# Patient Record
Sex: Female | Born: 1950 | Race: White | Hispanic: No | Smoking: Never smoker
Health system: Southern US, Community
[De-identification: ages and names within clinical notes are randomized; demographics above are authoritative.]

## PROBLEM LIST (undated history)

## (undated) DIAGNOSIS — H3530 Unspecified macular degeneration: Secondary | ICD-10-CM

## (undated) DIAGNOSIS — Q782 Osteopetrosis: Secondary | ICD-10-CM

---

## 1998-08-02 ENCOUNTER — Other Ambulatory Visit: Admission: RE | Admit: 1998-08-02 | Discharge: 1998-08-02 | Payer: Self-pay | Admitting: Obstetrics and Gynecology

## 1999-09-13 ENCOUNTER — Other Ambulatory Visit: Admission: RE | Admit: 1999-09-13 | Discharge: 1999-09-13 | Payer: Self-pay | Admitting: Obstetrics and Gynecology

## 2001-05-11 ENCOUNTER — Inpatient Hospital Stay (HOSPITAL_COMMUNITY): Admission: AD | Admit: 2001-05-11 | Discharge: 2001-05-11 | Payer: Self-pay | Admitting: Obstetrics and Gynecology

## 2001-06-25 ENCOUNTER — Encounter: Payer: Self-pay | Admitting: Obstetrics and Gynecology

## 2001-06-25 ENCOUNTER — Encounter: Admission: RE | Admit: 2001-06-25 | Discharge: 2001-06-25 | Payer: Self-pay | Admitting: Obstetrics and Gynecology

## 2005-01-19 ENCOUNTER — Encounter: Admission: RE | Admit: 2005-01-19 | Discharge: 2005-01-19 | Payer: Self-pay | Admitting: Obstetrics and Gynecology

## 2006-11-27 ENCOUNTER — Other Ambulatory Visit: Admission: RE | Admit: 2006-11-27 | Discharge: 2006-11-27 | Payer: Self-pay | Admitting: Cardiology

## 2006-12-17 ENCOUNTER — Other Ambulatory Visit: Admission: RE | Admit: 2006-12-17 | Discharge: 2006-12-17 | Payer: Self-pay | Admitting: Internal Medicine

## 2006-12-20 ENCOUNTER — Encounter: Admission: RE | Admit: 2006-12-20 | Discharge: 2006-12-20 | Payer: Self-pay | Admitting: Internal Medicine

## 2008-03-17 ENCOUNTER — Encounter: Admission: RE | Admit: 2008-03-17 | Discharge: 2008-03-17 | Payer: Self-pay | Admitting: Internal Medicine

## 2010-07-10 ENCOUNTER — Encounter: Payer: Self-pay | Admitting: Internal Medicine

## 2010-07-11 ENCOUNTER — Encounter: Payer: Self-pay | Admitting: Infectious Diseases

## 2010-10-24 ENCOUNTER — Other Ambulatory Visit (HOSPITAL_COMMUNITY): Payer: Self-pay | Admitting: Internal Medicine

## 2010-10-24 DIAGNOSIS — Z1231 Encounter for screening mammogram for malignant neoplasm of breast: Secondary | ICD-10-CM

## 2010-11-02 ENCOUNTER — Ambulatory Visit (HOSPITAL_COMMUNITY)
Admission: RE | Admit: 2010-11-02 | Discharge: 2010-11-02 | Disposition: A | Discharge status: Home / Self Care | Payer: Self-pay | Source: Ambulatory Visit | Attending: Internal Medicine | Admitting: Internal Medicine

## 2010-11-02 DIAGNOSIS — Z1231 Encounter for screening mammogram for malignant neoplasm of breast: Secondary | ICD-10-CM

## 2011-09-11 ENCOUNTER — Other Ambulatory Visit (HOSPITAL_COMMUNITY): Payer: Self-pay | Admitting: Obstetrics

## 2011-09-11 DIAGNOSIS — Z1231 Encounter for screening mammogram for malignant neoplasm of breast: Secondary | ICD-10-CM

## 2011-09-12 ENCOUNTER — Other Ambulatory Visit: Payer: Self-pay | Admitting: Obstetrics

## 2011-09-12 DIAGNOSIS — Z1231 Encounter for screening mammogram for malignant neoplasm of breast: Secondary | ICD-10-CM

## 2011-10-05 ENCOUNTER — Ambulatory Visit (HOSPITAL_COMMUNITY): Payer: Self-pay

## 2011-11-03 ENCOUNTER — Ambulatory Visit (HOSPITAL_COMMUNITY): Payer: Self-pay

## 2012-01-12 ENCOUNTER — Ambulatory Visit (HOSPITAL_COMMUNITY): Payer: Self-pay

## 2012-01-25 ENCOUNTER — Ambulatory Visit (HOSPITAL_COMMUNITY): Payer: Self-pay

## 2014-07-04 ENCOUNTER — Ambulatory Visit (INDEPENDENT_AMBULATORY_CARE_PROVIDER_SITE_OTHER): Payer: Self-pay

## 2014-07-04 ENCOUNTER — Ambulatory Visit (INDEPENDENT_AMBULATORY_CARE_PROVIDER_SITE_OTHER): Payer: Self-pay | Admitting: Internal Medicine

## 2014-07-04 VITALS — BP 96/60 | HR 68 | Temp 98.5°F | Resp 16 | Ht 65.75 in | Wt 129.1 lb

## 2014-07-04 DIAGNOSIS — M79674 Pain in right toe(s): Secondary | ICD-10-CM

## 2014-07-04 DIAGNOSIS — S90111A Contusion of right great toe without damage to nail, initial encounter: Secondary | ICD-10-CM

## 2014-07-04 NOTE — Progress Notes (Signed)
   Subjective:    Patient ID: Cynthia Herman, female    DOB: 14-Feb-1951, 64 y.o.   MRN: [id number]  HPI Walking in parking lot in flip flops, great toe hyperflexed and contused. @ weeks of swelling and pain. Most tender distal right great toe joint.   Review of Systems     Objective:   Physical Exam  Constitutional: She is oriented to person, place, and time. She appears well-developed and well-nourished. No distress.  HENT:  Head: Normocephalic.  Eyes: EOM are normal.  Neck: Normal range of motion.  Pulmonary/Chest: Effort normal.  Musculoskeletal:       Right foot: There is decreased range of motion, tenderness, bony tenderness and swelling. There is normal capillary refill, no crepitus, no deformity and no laceration.       Feet:  Neurological: She is alert and oriented to person, place, and time. She exhibits normal muscle tone. Coordination normal.  Psychiatric: She has a normal mood and affect. Her behavior is normal. Thought content normal.     [facility] reading (PRIMARY) by  Dr.Marlena Barbato fx distal and proximal phalanx medial non displaced great toe       Assessment & Plan:  Right great toe pain/FX Camwalker/RICE till pain free Recheck 3 weeks/Warned about chronic pain and counseled

## 2014-07-04 NOTE — Patient Instructions (Signed)
Toe Fracture Your caregiver has diagnosed you as having a fractured toe. A toe fracture is a break in the bone of a toe. "Buddy taping" is a way of splinting your broken toe, by taping the broken toe to the toe next to it. This "buddy taping" will keep the injured toe from moving beyond normal range of motion. Buddy taping also helps the toe heal in a more normal alignment. It may take 6 to 8 weeks for the toe injury to heal. HOME CARE INSTRUCTIONS   Leave your toes taped together for as long as directed by your caregiver or until you see a doctor for a follow-up examination. You can change the tape after bathing. Always use a small piece of gauze or cotton between the toes when taping them together. This will help the skin stay dry and prevent infection.  Apply ice to the injury for 15-20 minutes each hour while awake for the first 2 days. Put the ice in a plastic bag and place a towel between the bag of ice and your skin.  After the first 2 days, apply heat to the injured area. Use heat for the next 2 to 3 days. Place a heating pad on the foot or soak the foot in warm water as directed by your caregiver.  Keep your foot elevated as much as possible to lessen swelling.  Wear sturdy, supportive shoes. The shoes should not pinch the toes or fit tightly against the toes.  Your caregiver may prescribe a rigid shoe if your foot is very swollen.  Your may be given crutches if the pain is too great and it hurts too much to walk.  Only take over-the-counter or prescription medicines for pain, discomfort, or fever as directed by your caregiver.  If your caregiver has given you a follow-up appointment, it is very important to keep that appointment. Not keeping the appointment could result in a chronic or permanent injury, pain, and disability. If there is any problem keeping the appointment, you must call back to this facility for assistance. SEEK MEDICAL CARE IF:   You have increased pain or swelling,  not relieved with medications.  The pain does not get better after 1 week.  Your injured toe is cold when the others are warm. SEEK IMMEDIATE MEDICAL CARE IF:   The toe becomes cold, numb, or white.  The toe becomes hot (inflamed) and red. Document Released: 06/02/2000 Document Revised: 08/28/2011 Document Reviewed: 01/20/2008 [facility] Patient Information 2015 [place], [place]. This information is not intended to replace advice given to you by your health care provider. Make sure you discuss any questions you have with your health care provider. Toe Fracture  with Rehab A fracture is a break in the bone that can be either partial or complete. Fractures of the toe bones may or may not include the joints that separate the bones. SYMPTOMS   Severe pain over the fracture site at the time of injury that may persist for an extend period of time.  Pain, tenderness, inflammation, and/or bruising (contusion) over the fracture site.  Visible deformity, if the bone fragments are not properly aligned (displaced fracture).  Signs of vascular damage: numbness or coldness (uncommon). CAUSES  Toe fractures occur when a force is placed on the bone that is greater than it can withstand.  Direct hit (trauma) to the toe.  Indirect trauma to the toe, such as forcefully pivoting on a planted foot. RISK INCREASES WITH:  Performing activities barefoot (i.e. ballet, gymnastics).  Wearing shoes with little support or protection.  Sports with cleats (i.e. football, rugby, lacrosse, soccer).  Bone disease (i.e. osteoporosis, bone tumors). PREVENTION   Wear properly fitted and protective shoes.  Protect previously injured toes with tape or padding. PROGNOSIS  If treated properly, toe fractures usually heal within 4 to 6 weeks. RELATED COMPLICATIONS   Failure of the fracture to heal (nonunion).  Healing of the fracture in a poor position (malunion).  Recurring symptoms.  Recurring symptoms  that result in a chronic problem.  Excessive bleeding, causing pressure on nerves and blood vessels (rare).  Arthritis of the affected joints.  Stopping of bone growth in children.  Infection in fractures where the skin is broken over the fracture (open fracture).  Shortening of injured bones. TREATMENT  Treatment first involves the use of ice and medicine to reduce pain and inflammation. The toe should be restrained for a period of time to allow for healing, usually about 4 weeks. Your caregiver may advise wearing a hard-soled shoe to minimize stress on the healing bone. Surgery is uncommon for this injury, but may be necessary if the fracture is severely displaced or if the bone pushes through the skin. Surgery typically involves the use of screws, pins, and/or plates to hold the fracture in place. After surgery, restraint of the foot is necessary. MEDICATION   If pain medicine is necessary, nonsteroidal anti-inflammatory medications (aspirin and ibuprofen), or other minor pain relievers (acetaminophen), are often recommended.  Do not take pain medicine for 7 days before surgery.  Prescription pain relievers may be given if your caregiver thinks they are needed. Use only as directed and only as much as you need. COLD THERAPY  Cold treatment (icing) relieves pain and reduces inflammation. Cold treatment should be applied for 10 to 15 minutes every 2 to 3 hours, and immediately after activity that aggravates your symptoms. Use ice packs or an ice massage. SEEK MEDICAL CARE IF:   Treatment does not seem to help, or the condition gets worse.  Any medicines produce negative side effects.  Any complications from surgery occur:  Pain, numbness, or coldness in the affected foot.  Discoloration beneath the toenails (blue or gray) of the affected foot.  Signs of infection (fever, pain, inflammation, redness, or persistent bleeding). EXERCISES RANGE OF MOTION (ROM) AND STRETCHING EXERCISES  - Toe Fracture (Phalangeal) These exercises may help you when beginning to rehabilitate your injury. Your symptoms may resolve with or without further involvement from your physician, physical therapist or athletic trainer. While completing these exercises, remember:   Restoring tissue flexibility helps normal motion to return to the joints. This allows healthier, less painful movement and activity.  An effective stretch should be held for at least 30 seconds.  A stretch should never be painful. You should only feel a gentle lengthening or release in the stretched tissue. RANGE OF MOTION - Dorsi/Plantar Flexion  While sitting with your right / left knee straight, draw the top of your foot upwards by flexing your ankle. Then reverse the motion, pointing your toes downward.  Hold each position for __________ seconds.  After completing your first set of exercises, repeat this exercise with your knee bent. Repeat __________ times. Complete this exercise __________ times per day.  RANGE OF MOTION - Ankle Alphabet Imagine your right / left big toe is a pen. Keeping your hip and knee still, write out the entire alphabet with your "pen." Make the letters as large as you can without increasing any discomfort.  Repeat __________ times. Complete this exercise __________ times per day.  RANGE OF MOTION - Toe Extension, Flexion  Sit with your right / left leg crossed over your opposite knee.  Grasp your toes and gently pull them back toward the top of your foot. You should feel a stretch on the bottom of your toes and foot.  Hold this stretch for __________ seconds.  Now, gently pull your toes toward the bottom of your foot. You should feel a stretch on the top of your toes and foot.  Hold this stretch for __________ seconds. Repeat __________ times. Complete this stretch__________ times per day.  STRENGTHENING EXERCISES - Toe Fracture (Phalangeal) These exercises may help you when beginning to  rehabilitate your injury. They may resolve your symptoms with or without further involvement from your physician, physical therapist or athletic trainer. While completing these exercises, remember:   Muscles can gain both the endurance and the strength needed for everyday activities through controlled exercises.  Complete these exercises as instructed by your physician, physical therapist or athletic trainer. Increase the resistance and repetitions only as guided.  You may experience muscle soreness or fatigue, but the pain or discomfort you are trying to eliminate should never worsen during these exercises. If this pain does get worse, stop and make sure you are following the directions exactly. If the pain is still present after adjustments, discontinue the exercise until you can discuss the trouble with your clinician. STRENGTH - Towel Curls  Sit in a chair, on a non-carpeted surface.  Place your foot on a towel, keeping your heel on the floor.  Pull the towel toward your heel only by curling your toes. Keep your heel on the floor.  If instructed by your physician, physical therapist or athletic trainer, add ____________________ at the end of the towel. Repeat __________ times. Complete this exercise __________ times per day. Document Released: 06/05/2005 Document Revised: 08/28/2011 Document Reviewed: 09/17/2008 [facility] Patient Information 2015 [place], [place]. This information is not intended to replace advice given to you by your health care provider. Make sure you discuss any questions you have with your health care provider.

## 2014-12-23 ENCOUNTER — Ambulatory Visit (INDEPENDENT_AMBULATORY_CARE_PROVIDER_SITE_OTHER): Payer: Self-pay

## 2014-12-23 ENCOUNTER — Ambulatory Visit (HOSPITAL_COMMUNITY)
Admission: RE | Admit: 2014-12-23 | Discharge: 2014-12-23 | Disposition: A | Discharge status: Home / Self Care | Payer: No Typology Code available for payment source | Source: Ambulatory Visit | Attending: Emergency Medicine | Admitting: Emergency Medicine

## 2014-12-23 ENCOUNTER — Ambulatory Visit (INDEPENDENT_AMBULATORY_CARE_PROVIDER_SITE_OTHER): Payer: Self-pay | Admitting: Emergency Medicine

## 2014-12-23 DIAGNOSIS — R4020 Unspecified coma: Secondary | ICD-10-CM

## 2014-12-23 DIAGNOSIS — M542 Cervicalgia: Secondary | ICD-10-CM

## 2014-12-23 DIAGNOSIS — M5032 Other cervical disc degeneration, mid-cervical region: Secondary | ICD-10-CM | POA: Insufficient documentation

## 2014-12-23 DIAGNOSIS — R404 Transient alteration of awareness: Secondary | ICD-10-CM

## 2014-12-23 DIAGNOSIS — S0180XA Unspecified open wound of other part of head, initial encounter: Secondary | ICD-10-CM

## 2014-12-23 DIAGNOSIS — Z23 Encounter for immunization: Secondary | ICD-10-CM
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
## 2014-12-23 NOTE — Patient Instructions (Addendum)
Head Injury You have received a head injury. It does not appear serious at this time. Headaches and vomiting are common following head injury. It should be easy to awaken from sleeping. Sometimes it is necessary for you to stay in the emergency department for a while for observation. Sometimes admission to the hospital may be needed. After injuries such as yours, most problems occur within the first 24 hours, but side effects may occur up to 7-10 days after the injury. It is important for you to carefully monitor your condition and contact your health care provider or seek immediate medical care if there is a change in your condition. WHAT ARE THE TYPES OF HEAD INJURIES? Head injuries can be as minor as a bump. Some head injuries can be more severe. More severe head injuries include:  A jarring injury to the brain (concussion).  A bruise of the brain (contusion). This mean there is bleeding in the brain that can cause swelling.  A cracked skull (skull fracture).  Bleeding in the brain that collects, clots, and forms a bump (hematoma). WHAT CAUSES A HEAD INJURY? A serious head injury is most likely to happen to someone who is in a car wreck and is not wearing a seat belt. Other causes of major head injuries include bicycle or motorcycle accidents, sports injuries, and falls. HOW ARE HEAD INJURIES DIAGNOSED? A complete history of the event leading to the injury and your current symptoms will be helpful in diagnosing head injuries. Many times, pictures of the brain, such as CT or MRI are needed to see the extent of the injury. Often, an overnight hospital stay is necessary for observation.  WHEN SHOULD I SEEK IMMEDIATE MEDICAL CARE?  You should get help right away if:  You have confusion or drowsiness.  You feel sick to your stomach (nauseous) or have continued, forceful vomiting.  You have dizziness or unsteadiness that is getting worse.  You have severe, continued headaches not relieved by  medicine. Only take over-the-counter or prescription medicines for pain, fever, or discomfort as directed by your health care provider.  You do not have normal function of the arms or legs or are unable to walk.  You notice changes in the black spots in the center of the colored part of your eye (pupil).  You have a clear or bloody fluid coming from your nose or ears.  You have a loss of vision. During the next 24 hours after the injury, you must stay with someone who can watch you for the warning signs. This person should contact local emergency services (911 in the [place] if you have seizures, you become unconscious, or you are unable to wake up. HOW CAN I PREVENT A HEAD INJURY IN THE FUTURE? The most important factor for preventing major head injuries is avoiding motor vehicle accidents. To minimize the potential for damage to your head, it is crucial to wear seat belts while riding in motor vehicles. Wearing helmets while bike riding and playing collision sports (like football) is also helpful. Also, avoiding dangerous activities around the house will further help reduce your risk of head injury.  WHEN CAN I RETURN TO NORMAL ACTIVITIES AND ATHLETICS? You should be reevaluated by your health care provider before returning to these activities. If you have any of the following symptoms, you should not return to activities or contact sports until 1 week after the symptoms have stopped:  Persistent headache.  Dizziness or vertigo.  Poor attention and concentration.  Confusion.  Memory problems.  Nausea or vomiting.  Fatigue or tire easily.  Irritability.  Intolerant of bright lights or loud noises.  Anxiety or depression.  Disturbed sleep. MAKE SURE YOU:   Understand these instructions.  Will watch your condition.  Will get help right away if you are not doing well or get worse. Document Released: 06/05/2005 Document Revised: 06/10/2013 Document Reviewed:  02/10/2013 [facility] Patient Information 2015 [place], [place]. This information is not intended to replace advice given to you by your health care provider. Make sure you discuss any questions you have with your health care provider.    [facility] Go to outpatient to register. Do not sign in to the emergency room.

## 2014-12-25 ENCOUNTER — Telehealth: Payer: Self-pay
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
## 2014-12-25 NOTE — Telephone Encounter (Signed)
Pt is needing to talk with dr Cleta Albertsdaub about seeing a chiropractor before scheduling

## 2014-12-25 NOTE — Telephone Encounter (Signed)
Spoke with pt, she would like for Dr. Cleta Albertsaub to authorize her  chiropractic appointments so her insurance will pay. Can you do this?
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
## 2014-12-27 NOTE — Telephone Encounter (Signed)
Pt called stating she would like for Dr. Cleta Albertsaub to authorize her to have massage therapy.  However, she would like for him to add in the authorization letter that she can have massage therapy on an as needed bases.  I advised pt that he had approved us to type her an letter but now since she wants him to add as needed; I would have to send Dr. Cleta Albertsaub an phone message requesting his approval/advice.  Pt understood.  She would like for it be faxed over to [phone] asap because she is due to have an appt tomorrow 01/02/15 with an massage therapist  She also wants Dr. Cleta Albertsaub to call her or fax her insurance company an letter approving her to have chiro visit.  A message is already in Epic regarding this.

## 2014-12-28 NOTE — Telephone Encounter (Signed)
Will talk to dr Cleta Albertsdaub about this.

## 2014-12-28 NOTE — Telephone Encounter (Addendum)
Pt states she really need this to be taken care of by 9:00 this a.m. Because that is when she will get her massage. Please call [phone] and the fax is [phone]

## 2014-12-29 NOTE — Telephone Encounter (Signed)
Was this taken care of?

## 2014-12-29 NOTE — Telephone Encounter (Signed)
Cynthia Herman took care of this

## 2015-12-15 DIAGNOSIS — R5382 Chronic fatigue, unspecified: Secondary | ICD-10-CM | POA: Insufficient documentation

## 2015-12-15 DIAGNOSIS — M545 Low back pain, unspecified: Secondary | ICD-10-CM | POA: Insufficient documentation

## 2019-08-03 ENCOUNTER — Ambulatory Visit: Payer: Self-pay

## 2019-12-08 ENCOUNTER — Other Ambulatory Visit: Payer: Self-pay | Admitting: Physician Assistant

## 2019-12-08 DIAGNOSIS — Z1231 Encounter for screening mammogram for malignant neoplasm of breast: Secondary | ICD-10-CM

## 2019-12-11 ENCOUNTER — Ambulatory Visit: Payer: Self-pay

## 2019-12-15 ENCOUNTER — Ambulatory Visit: Payer: Self-pay

## 2019-12-17 ENCOUNTER — Ambulatory Visit: Payer: Self-pay

## 2019-12-18 ENCOUNTER — Other Ambulatory Visit: Payer: Self-pay | Admitting: Plastic Surgery

## 2019-12-18 ENCOUNTER — Other Ambulatory Visit: Payer: Self-pay

## 2019-12-18 ENCOUNTER — Ambulatory Visit
Admission: RE | Admit: 2019-12-18 | Discharge: 2019-12-18 | Disposition: A | Discharge status: Home / Self Care | Payer: Medicare Other | Source: Ambulatory Visit | Attending: Physician Assistant | Admitting: Physician Assistant

## 2019-12-18 DIAGNOSIS — Z1231 Encounter for screening mammogram for malignant neoplasm of breast: Secondary | ICD-10-CM

## 2019-12-24 ENCOUNTER — Other Ambulatory Visit: Payer: Self-pay | Admitting: Plastic Surgery

## 2019-12-24 DIAGNOSIS — R928 Other abnormal and inconclusive findings on diagnostic imaging of breast: Secondary | ICD-10-CM

## 2021-04-04 IMAGING — MG DIGITAL SCREENING BREAST BILAT IMPLANT W/ TOMO W/ CAD
9 of 12 series · 9 of 28 positions shown · non-contrast
Comparison: Previous exams.

CLINICAL DATA: Screening.

EXAM:
DIGITAL SCREENING BILATERAL MAMMOGRAM WITH IMPLANTS, CAD AND TOMO
The patient has bilateral retropectoral silicone implants. Standard
and implant displaced views were performed.

[L MLO]
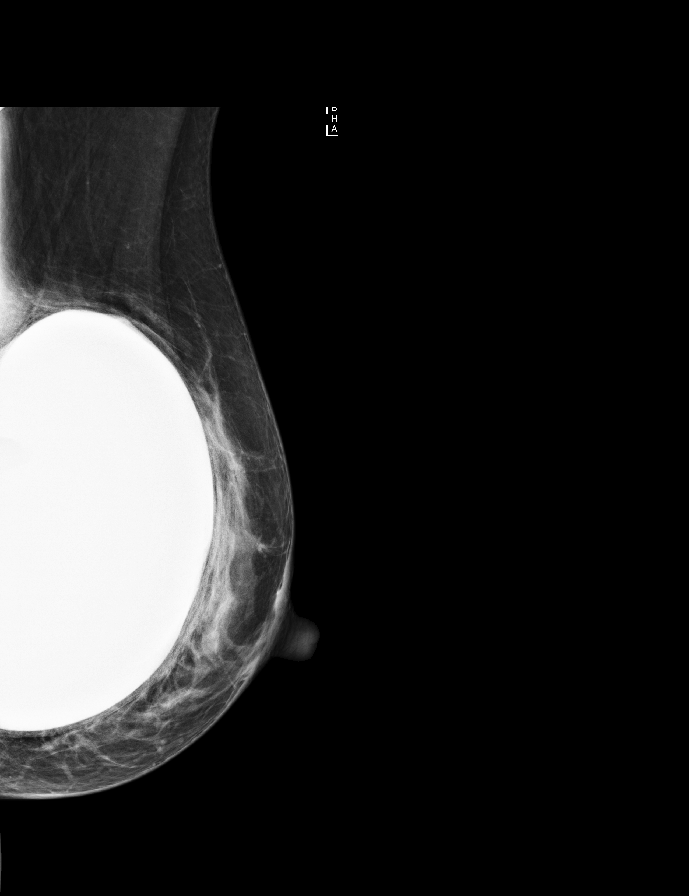

[R CC]
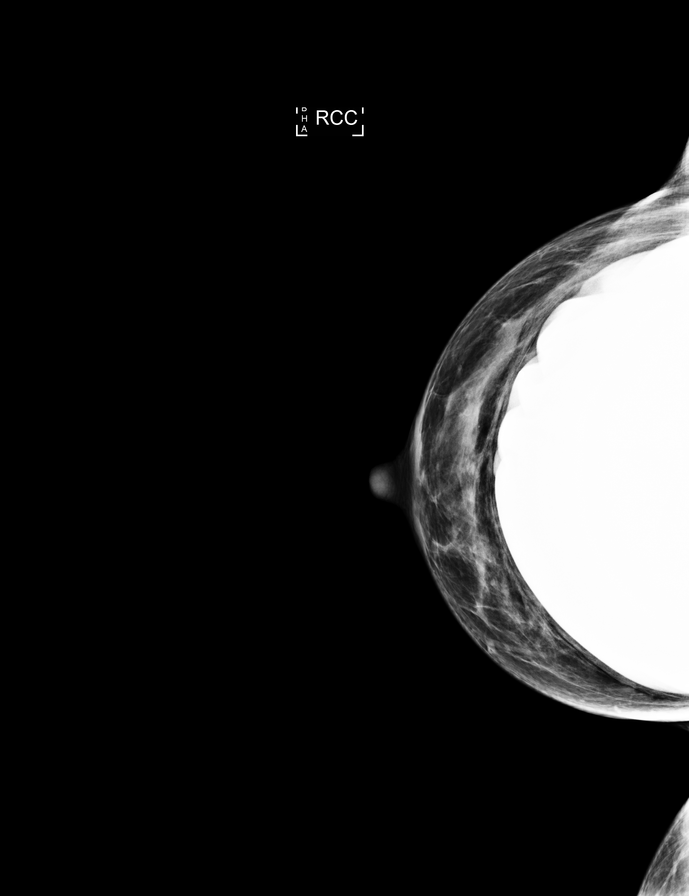

[L CC]
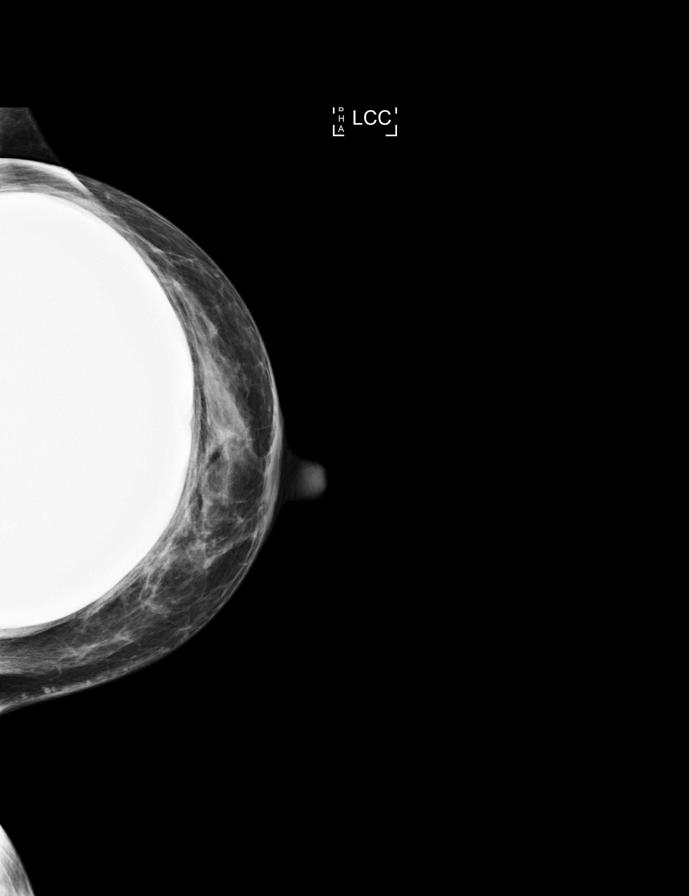

[R MLO]
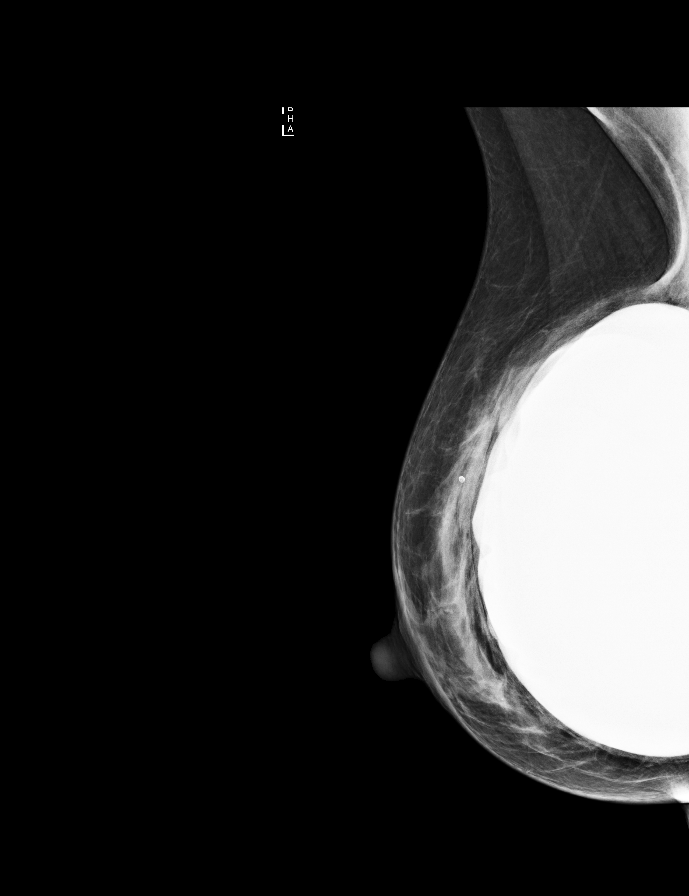

[L CC synth-2D]
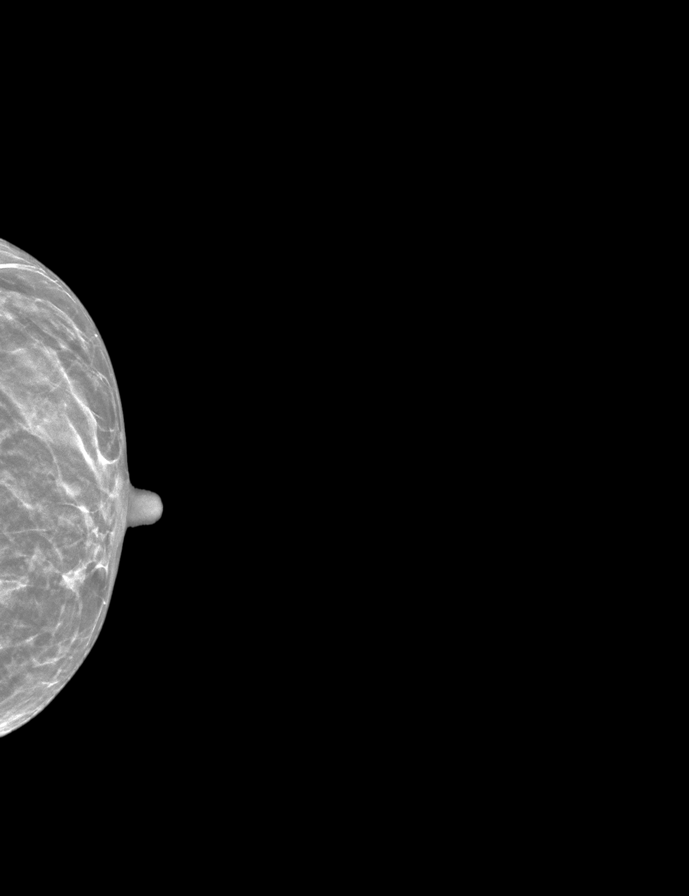

[R MLO synth-2D]
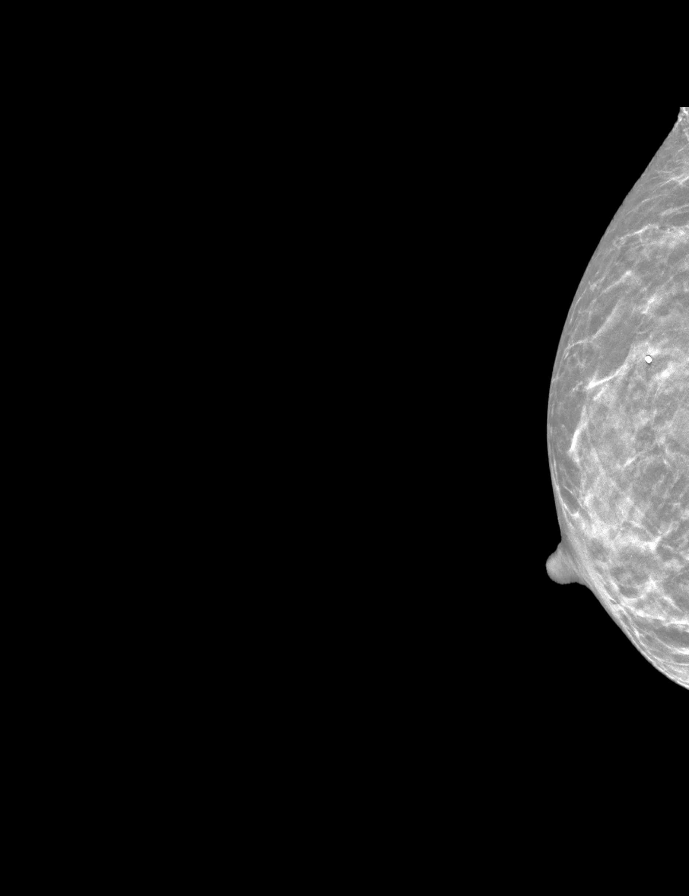

[R CC synth-2D]
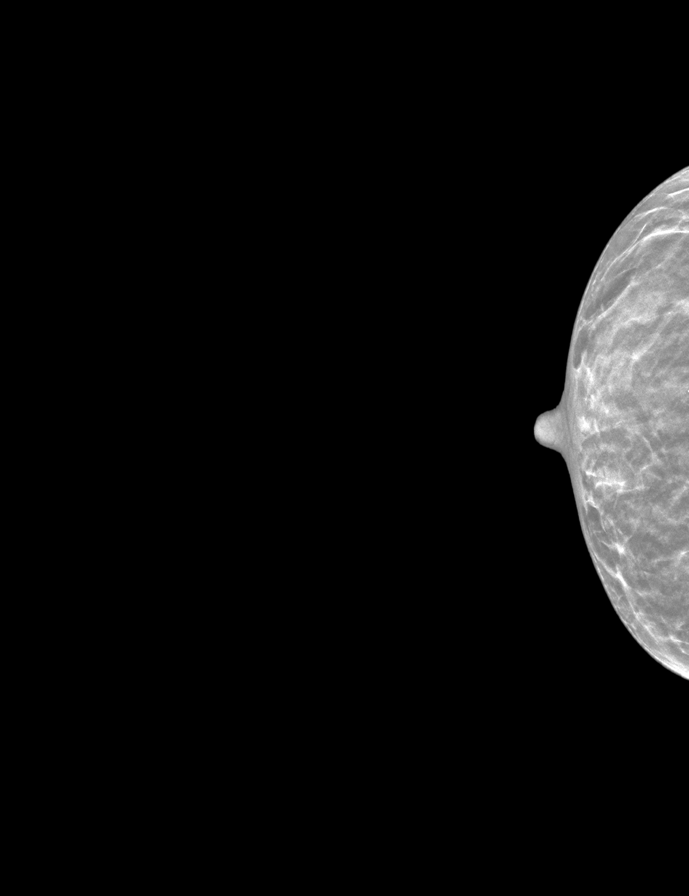

[L MLO synth-2D]
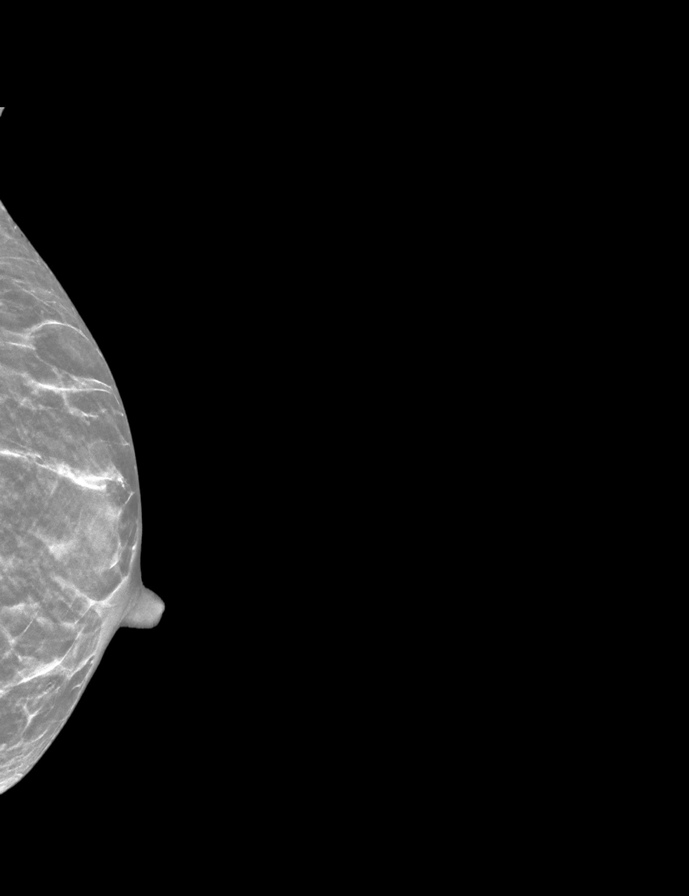

[R MLOID BREAST TOMOSYNTHESIS IMAGE tomo · tomo slice 17/32.0]
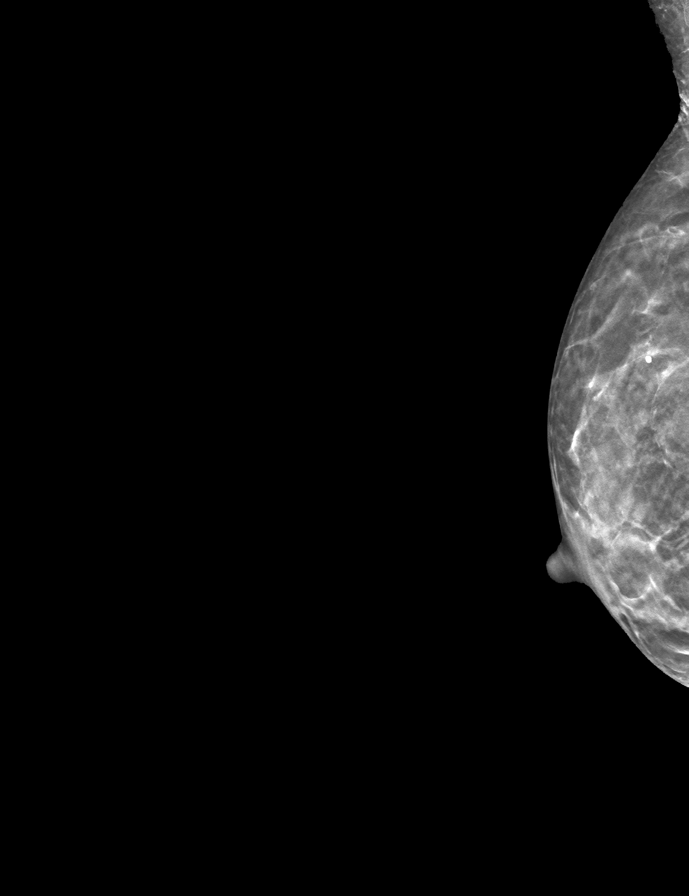

[9 of 28 positions shown; findings below may reference images not displayed]

ACR Breast Density Category c: The breast tissue is heterogeneously
dense, which may obscure small masses.
FINDINGS: In the left breast, a possible asymmetry warrants further
evaluation. In the right breast, no suspicious masses or malignant
type calcifications are identified. Mild contour bulges/undulations
along the anterolateral aspect of the right breast implant, new
since prior mammograms from 3693. Images were processed with CAD.
IMPRESSION: Further evaluation is suggested for possible asymmetry in the left
breast.

RECOMMENDATION:
Diagnostic mammogram and possibly ultrasound of the left breast.
(Code:G1-S-SS1)

The patient will be contacted regarding the findings, and additional
imaging will be scheduled.

BI-RADS CATEGORY  0: Incomplete. Need additional imaging evaluation
and/or prior mammograms for comparison.

## 2021-07-06 ENCOUNTER — Encounter: Payer: Medicare Other | Admitting: Family Medicine

## 2021-08-17 ENCOUNTER — Encounter: Payer: Medicare Other | Admitting: Family Medicine

## 2023-03-26 LAB — HM DEXA SCAN

## 2023-03-30 NOTE — Progress Notes (Unsigned)
   Rubin Payor, PhD, LAT, ATC acting as a scribe for Clementeen Graham, MD.  Cynthia Herman is a 72 y.o. female who presents to [facility] today for osteoporosis management. She notes that she works out w/ a Systems analyst. She notes a cousin who is in her 54's w/ hx of osteoporosis.  She is trying to maximize exercise and nutrition.  Her acupuncturist has suggested some herbal supplements as well for other health concerns.  Additionally she is working on Secretary/administrator and mind-body connection to help with health ailments including osteoporosis.  DEXA scan (date, T-score): 03/26/23: Spine= -2.3, L-FN= -3.1, R-FN= -3.7 Prior treatment: none History of Hip, Spine, or Wrist Fx: no Heart disease or stroke: no Cancer: Family hx- ovarian CA= niece, breast CA>50 y/o= sister, cervical CA= sister Kidney Disease: no Gastric/Peptic Ulcer: no Gastric bypass surgery: no Severe GERD: no Hx of seizures: no Age at Menopause: 3 Calcium intake: 600mg  Vitamin D intake: yes Hormone replacement therapy: no Smoking history: never Alcohol: occasional Exercise: yes- moderate, 5-7 x/wk Major dental work in past year: no Parents with hip/spine fracture: no Height loss: yes, loss 1/2"   Pertinent review of systems: No fevers or chills  Relevant historical information: Chronic back pain   Exam:  BP 112/68   Pulse 78   Ht 5\' 6"  (1.676 m)   Wt 122 lb (55.3 kg)   SpO2 98%   BMI 19.69 kg/m  General: Well Developed, well nourished, and in no acute distress.   MSK: Normal motion and gait.      Assessment and Plan: 72 y.o. female with severe osteoporosis. Cynthia Herman would prefer a natural regimen for her osteoporosis management if possible and is reluctant to consider medications.  I noted that based on the severity of her osteoporosis and T-score it is unlikely that she is going to get significant improvement in her bone density by maximizing weightbearing exercise and  resistance training and vitamin D and calcium.  However I think it is worth trying.  Will proceed with metabolic panel and vitamin D now and recommend recheck bone density test in 1 year.  If we were to use a medication on her Prolia is probably the best option. Total encounter time 45 minutes including face-to-face time with the patient and, reviewing past medical record, and charting on the date of service.     PDMP not reviewed this encounter. Orders Placed This Encounter  Procedures   Comprehensive metabolic panel    Osteoporis    Standing Status:   Future    Number of Occurrences:   1    Standing Expiration Date:   04/03/2024   VITAMIN D 25 Hydroxy (Vit-D Deficiency, Fractures)    Osteoporis    Standing Status:   Future    Number of Occurrences:   1    Standing Expiration Date:   04/03/2024   No orders of the defined types were placed in this encounter.    Discussed warning signs or symptoms. Please see discharge instructions. Patient expresses understanding.   The above documentation has been reviewed and is accurate and complete Clementeen Graham, M.D.

## 2023-04-04 ENCOUNTER — Ambulatory Visit: Payer: Medicare Other | Admitting: Family Medicine

## 2023-04-04 VITALS — BP 112/68 | HR 78 | Ht 66.0 in | Wt 122.0 lb

## 2023-04-04 DIAGNOSIS — M810 Age-related osteoporosis without current pathological fracture: Secondary | ICD-10-CM

## 2023-04-04 NOTE — Patient Instructions (Addendum)
Thank you for coming in today.   Please get labs today before you leave   OsteoStrong  We will plan to recheck your Bone Density in a year.

## 2023-04-05 DIAGNOSIS — M810 Age-related osteoporosis without current pathological fracture: Secondary | ICD-10-CM | POA: Insufficient documentation

## 2023-04-05 LAB — COMPREHENSIVE METABOLIC PANEL
ALT: 18 U/L (ref 0–35)
AST: 25 U/L (ref 0–37)
Albumin: 4.5 g/dL (ref 3.5–5.2)
Alkaline Phosphatase: 69 U/L (ref 39–117)
BUN: 19 mg/dL (ref 6–23)
CO2: 32 meq/L (ref 19–32)
Calcium: 10.5 mg/dL (ref 8.4–10.5)
Chloride: 101 meq/L (ref 96–112)
Creatinine, Ser: 0.89 mg/dL (ref 0.40–1.20)
GFR: 65.76 mL/min (ref >60.00)
Glucose, Bld: 145 mg/dL — ABNORMAL HIGH (ref 70–99)
Potassium: 4.2 meq/L (ref 3.5–5.1)
Sodium: 139 meq/L (ref 135–145)
Total Bilirubin: 1 mg/dL (ref 0.2–1.2)
Total Protein: 7.2 g/dL (ref 6.0–8.3)

## 2023-04-05 LAB — VITAMIN D 25 HYDROXY (VIT D DEFICIENCY, FRACTURES): VITD: 47.3 ng/mL (ref 30.00–100.00)

## 2023-04-05 NOTE — Progress Notes (Signed)
Good news!  Your vitamin D level looks great.  Keep doing what you are doing with vitamin D.  Your calcium level also looks okay.  Recommend proceeding to osteo strong like we talked about.  Recommend getting a bone density test in 1 year.

## 2023-10-27 DIAGNOSIS — H35721 Serous detachment of retinal pigment epithelium, right eye: Secondary | ICD-10-CM | POA: Diagnosis not present

## 2023-10-27 DIAGNOSIS — H2513 Age-related nuclear cataract, bilateral: Secondary | ICD-10-CM | POA: Diagnosis not present

## 2023-10-27 DIAGNOSIS — H353132 Nonexudative age-related macular degeneration, bilateral, intermediate dry stage: Secondary | ICD-10-CM | POA: Diagnosis not present

## 2023-12-04 DIAGNOSIS — H2513 Age-related nuclear cataract, bilateral: Secondary | ICD-10-CM | POA: Diagnosis not present

## 2023-12-04 DIAGNOSIS — H353132 Nonexudative age-related macular degeneration, bilateral, intermediate dry stage: Secondary | ICD-10-CM | POA: Diagnosis not present

## 2023-12-04 DIAGNOSIS — H43813 Vitreous degeneration, bilateral: Secondary | ICD-10-CM | POA: Diagnosis not present

## 2023-12-25 DIAGNOSIS — R92333 Mammographic heterogeneous density, bilateral breasts: Secondary | ICD-10-CM | POA: Diagnosis not present

## 2023-12-25 DIAGNOSIS — Z1231 Encounter for screening mammogram for malignant neoplasm of breast: Secondary | ICD-10-CM | POA: Diagnosis not present

## 2024-04-07 ENCOUNTER — Other Ambulatory Visit: Payer: Self-pay

## 2024-04-07 ENCOUNTER — Encounter: Payer: Self-pay | Admitting: Physical Therapy

## 2024-04-07 ENCOUNTER — Ambulatory Visit: Admitting: Physical Therapy

## 2024-04-07 DIAGNOSIS — M6281 Muscle weakness (generalized): Secondary | ICD-10-CM | POA: Diagnosis not present

## 2024-04-07 NOTE — Patient Instructions (Signed)
 Access Code: [email or web address] URL: [email or web address] Date: 04/07/2024 Prepared by: Elaine Daring  Exercises - Bird Dog  - 3 x weekly - 2 sets - 10 reps - 2 seconds hold - Step Up  - 3 x weekly - 2 sets - 10 reps - Standing Single Leg Stance with Counter Support  - 3 x weekly - 2 reps - 30 seconds hold - Push-Up on Counter  - 3 x weekly - 2 sets - 8 reps - Standing Row with Anchored Resistance  - 3 x weekly - 2 sets - 8 reps

## 2024-04-07 NOTE — Therapy (Signed)
 OUTPATIENT PHYSICAL THERAPY EVALUATION   Patient Name: Cynthia Herman MRN: [id number] DOB:April 14, 1951, 73 y.o., female Today's Date: 04/07/2024   END OF SESSION:  PT End of Session - 04/07/24 1337     Visit Number 1    Number of Visits 9    Date for Recertification  06/02/24    Authorization Type UHC MCR    Progress Note Due on Visit 10    PT Start Time 1349    PT Stop Time 1440    PT Time Calculation (min) 51 min    Activity Tolerance Patient tolerated treatment well    Behavior During Therapy [facility] for tasks assessed/performed          History reviewed. No pertinent past medical history. History reviewed. No pertinent surgical history. Patient Active Problem List   Diagnosis Date Noted   Osteoporosis 04/05/2023   Chronic bilateral low back pain without sciatica 12/15/2015   Chronic fatigue 12/15/2015    PCP: None  REFERRING PROVIDER: Lequita Evalene LABOR, MD  REFERRING DIAG: Age-related osteoporosis without current pathological fracture  Rationale for Evaluation and Treatment: Rehabilitation  THERAPY DIAG:  Muscle weakness (generalized)  ONSET DATE: Chronic (referral date 03/31/2024)   SUBJECTIVE SUBJECTIVE STATEMENT: Patient reports osteoporosis in femoral neck and she has been doing exercises at home and with a personal trainer, but the personal trainer is leaving for a while. She reports she is not able to take medication for the osteoporosis and her most recent scan showed that her bone mineral density has not gotten worse, but also did not improve. She would like to work on exercises for home and the gym to improve her bone mineral density. Below is a list of exercises patient is currently doing at home:  LAQ, Standing hamstring curls, Squats, Standing hip abduction, Standing march, Wall squat with weights overhead, Heel raises edge of step, Upper body weights with dumbbells, bicep curls, overhead press, high pull, Brisk walk 20 minutes  PERTINENT HISTORY:   See PMH above  PAIN:  Are you having pain? No  PRECAUTIONS: None  RED FLAGS: None   WEIGHT BEARING RESTRICTIONS: No  FALLS:  Has patient fallen in last 6 months? No  PLOF: Independent  PATIENT GOALS: Exercise for osteoporosis    OBJECTIVE:  Note: Objective measures were completed at Evaluation unless otherwise noted. PATIENT SURVEYS:  Not assessed at eval  COGNITION: Overall cognitive status: Within functional limits for tasks assessed     SENSATION: WFL  MUSCLE LENGTH: Not assessed  POSTURE:   Mild rounded shoulder posture  PALPATION: Not assessed  LOWER EXTREMITY MMT:    MMT Right eval Left eval  Hip flexion 4 4  Hip extension 4 4  Hip abduction 4- 4-  Hip adduction    Hip internal rotation    Hip external rotation    Knee flexion 5 5  Knee extension 5 5  Ankle dorsiflexion    Ankle plantarflexion    Ankle inversion    Ankle eversion     (Blank rows = not tested)  FUNCTIONAL TESTS:  Sit to stand: patient able to stand from standard height chair without UE support, she does exhibit bilateral knee valgus  SLS: right 12 sec, left 7 sec  GAIT: Assistive device utilized: None Level of assistance: Complete Independence Comments: WFL   TREATMENT  [facility] Adult PT Treatment:  DATE: 04/07/2024 Bird dog x 10 Forward 8 step-up x 10 each SLS x 30 sec each Counter push-up x 8 Row with blue x 8  Discussed her current exercises and gym routine in detail; goal of exercises for osteoporosis to emphasize weight bearing, posture, balance; patient working with Systems analyst  PATIENT EDUCATION:  Education details: Exam findings, POC, HEP Person educated: Patient Education method: Programmer, multimedia, Demonstration, Tactile cues, Verbal cues, and Handouts Education comprehension: verbalized understanding, returned demonstration, verbal cues required, tactile cues required, and needs further education  HOME  EXERCISE PROGRAM: Access Code: [email or web address]    ASSESSMENT: CLINICAL IMPRESSION: Patient is a 73 y.o. female who was seen today for physical therapy evaluation and treatment for osteoporosis and exercises to improve her bone mineral density. She does exhibit some postural deviations, strength deficits, and balance impairment that could place her at risk for future injury with osteoporosis, so patient would benefit from skilled PT to progress her strength and control in order to reduce risk for injury and maximize her functional ability.   OBJECTIVE IMPAIRMENTS: decreased activity tolerance, decreased strength, and postural dysfunction.   ACTIVITY LIMITATIONS: lifting  PARTICIPATION LIMITATIONS: community activity  PERSONAL FACTORS: Fitness, Past/current experiences, and Time since onset of injury/illness/exacerbation are also affecting patient's functional outcome.   REHAB POTENTIAL: Good  CLINICAL DECISION MAKING: Stable/uncomplicated  EVALUATION COMPLEXITY: Low   GOALS: Goals reviewed with patient? Yes  SHORT TERM GOALS: Target date: = LTG  LONG TERM GOALS: Target date: 06/02/2024  Patient will be I with final HEP to maintain progress from PT. Baseline: HEP provided at eval Goal status: INITIAL  2.  Patient will demonstrate hip abductor strength >/= 4/5 MMT in order to improve hip strength and control with squatting and stairs Baseline: grossly 4-/5 MMT Goal status: INITIAL  3.  Patient will demonstrate SLS >/= 30 sec each in order to improve her balance and reduce fall risk Baseline: see limitations above Goal status: INITIAL   PLAN: PT FREQUENCY: 1x/week  PT DURATION: 8 weeks  PLANNED INTERVENTIONS: 97164- PT Re-evaluation, 97750- Physical Performance Testing, 97110-Therapeutic exercises, 97530- Therapeutic activity, [id number]- Neuromuscular re-education, 97535- Self Care, [id number]- Manual therapy, Patient/Family education, Balance training, and Stair training.  PLAN FOR  NEXT SESSION: Review HEP and progress PRN, progress weight bearing exercises, postural and core strengthening, balance training   Elaine Daring, PT, DPT, LAT, ATC 04/07/24  3:05 PM Phone: [phone] Fax: [phone]

## 2024-04-14 ENCOUNTER — Other Ambulatory Visit: Payer: Self-pay

## 2024-04-14 ENCOUNTER — Ambulatory Visit: Admitting: Physical Therapy

## 2024-04-14 ENCOUNTER — Encounter: Payer: Self-pay | Admitting: Physical Therapy

## 2024-04-14 DIAGNOSIS — M6281 Muscle weakness (generalized): Secondary | ICD-10-CM | POA: Diagnosis not present

## 2024-04-14 NOTE — Patient Instructions (Signed)
 Access Code: [email or web address] URL: [email or web address] Date: 04/14/2024 Prepared by: Elaine Daring  Exercises - Supine Bridge  - 3 x weekly - 2 sets - 10 reps - 5 seconds hold - Bird Dog  - 3 x weekly - 2 sets - 10 reps - 2 seconds hold - Step Up  - 3 x weekly - 2 sets - 10 reps - Standing Single Leg Stance with Counter Support  - 3 x weekly - 2 reps - 30 seconds hold - Push-Up on Counter  - 3 x weekly - 2 sets - 8 reps - Standing Row with Anchored Resistance  - 3 x weekly - 2 sets - 8 reps - Side Stepping with Resistance at Thighs  - 3 x weekly - 2 sets - 20 reps

## 2024-04-14 NOTE — Therapy (Signed)
 OUTPATIENT PHYSICAL THERAPY TREATMENT   Patient Name: Cynthia Herman MRN: [id number] DOB:[birth year], 73 y.o., female Today's Date: 04/14/2024   END OF SESSION:  PT End of Session - 04/14/24 1156     Visit Number 2    Number of Visits 9    Date for Recertification  06/02/24    Authorization Type UHC MCR    Progress Note Due on Visit 10    PT Start Time 1153    PT Stop Time 1231    PT Time Calculation (min) 38 min    Activity Tolerance Patient tolerated treatment well    Behavior During Therapy [facility] for tasks assessed/performed           History reviewed. No pertinent past medical history. History reviewed. No pertinent surgical history. Patient Active Problem List   Diagnosis Date Noted   Osteoporosis 04/05/2023   Chronic bilateral low back pain without sciatica 12/15/2015   Chronic fatigue 12/15/2015    PCP: None  REFERRING PROVIDER: Lequita Evalene LABOR, MD  REFERRING DIAG: Age-related osteoporosis without current pathological fracture  Rationale for Evaluation and Treatment: Rehabilitation  THERAPY DIAG:  Muscle weakness (generalized)  ONSET DATE: Chronic (referral date 03/31/2024)   SUBJECTIVE SUBJECTIVE STATEMENT: Patient reports she has been consistent with her exercises.  Eval: Patient reports osteoporosis in femoral neck and she has been doing exercises at home and with a personal trainer, but the personal trainer is leaving for a while. She reports she is not able to take medication for the osteoporosis and her most recent scan showed that her bone mineral density has not gotten worse, but also did not improve. She would like to work on exercises for home and the gym to improve her bone mineral density. Below is a list of exercises patient is currently doing at home:  LAQ, Standing hamstring curls, Squats, Standing hip abduction, Standing march, Wall squat with weights overhead, Heel raises edge of step, Upper body weights with dumbbells, bicep curls,  overhead press, high pull, Brisk walk 20 minutes  PERTINENT HISTORY:  See PMH above  PAIN:  Are you having pain? No  PRECAUTIONS: None  PATIENT GOALS: Exercise for osteoporosis    OBJECTIVE:  Note: Objective measures were completed at Evaluation unless otherwise noted. PATIENT SURVEYS:  Not assessed at eval  MUSCLE LENGTH: Not assessed  POSTURE:   Mild rounded shoulder posture  PALPATION: Not assessed  LOWER EXTREMITY MMT:    MMT Right eval Left eval  Hip flexion 4 4  Hip extension 4 4  Hip abduction 4- 4-  Hip adduction    Hip internal rotation    Hip external rotation    Knee flexion 5 5  Knee extension 5 5  Ankle dorsiflexion    Ankle plantarflexion    Ankle inversion    Ankle eversion     (Blank rows = not tested)  FUNCTIONAL TESTS:  Sit to stand: patient able to stand from standard height chair without UE support, she does exhibit bilateral knee valgus  SLS: right 12 sec, left 7 sec  GAIT: Assistive device utilized: None Level of assistance: Complete Independence Comments: [facility]   TREATMENT  [facility] Adult PT Treatment:                                                DATE: 04/14/2024 Bridge 2 x 10 Marching  bridge 2 x 5 each Bird dog 3 x 10 Squat to table tap holding 15# 3 x 10 Lateral band walk with green at knees 3 x 20 down/back  Continued discussion on osteoporosis and patient's diet and digestion in relation to her osteoporosis. Discussed weight bearing exercises and added load for stimulus for bone growth  PATIENT EDUCATION:  Education details: HEP update Person educated: Patient Education method: Explanation, Demonstration, Tactile cues, Verbal cues, and Handouts Education comprehension: verbalized understanding, returned demonstration, verbal cues required, tactile cues required, and needs further education  HOME EXERCISE PROGRAM: Access Code: [email or web address]    ASSESSMENT: CLINICAL IMPRESSION: Patient tolerated therapy well with no adverse  effects. Therapy focused primarily on exercises to strength core and hip musculature, as well as weight bearing exercises with added weight with good tolerance. She was able to progress with some strengthening this visit. Continued discussion on exercise and diet related to osteoporosis. Updated her HEP to progress hip strengthening for home. Patient would benefit from continued skilled PT to progress mobility and strength in order to reduce pain and maximize functional ability.   Eval: Patient is a 73 y.o. female who was seen today for physical therapy evaluation and treatment for osteoporosis and exercises to improve her bone mineral density. She does exhibit some postural deviations, strength deficits, and balance impairment that could place her at risk for future injury with osteoporosis, so patient would benefit from skilled PT to progress her strength and control in order to reduce risk for injury and maximize her functional ability.   OBJECTIVE IMPAIRMENTS: decreased activity tolerance, decreased strength, and postural dysfunction.   ACTIVITY LIMITATIONS: lifting  PARTICIPATION LIMITATIONS: community activity  PERSONAL FACTORS: Fitness, Past/current experiences, and Time since onset of injury/illness/exacerbation are also affecting patient's functional outcome.    GOALS: Goals reviewed with patient? Yes  SHORT TERM GOALS: Target date: = LTG  LONG TERM GOALS: Target date: 06/02/2024  Patient will be I with final HEP to maintain progress from PT. Baseline: HEP provided at eval Goal status: INITIAL  2.  Patient will demonstrate hip abductor strength >/= 4/5 MMT in order to improve hip strength and control with squatting and stairs Baseline: grossly 4-/5 MMT Goal status: INITIAL  3.  Patient will demonstrate SLS >/= 30 sec each in order to improve her balance and reduce fall risk Baseline: see limitations above Goal status: INITIAL   PLAN: PT FREQUENCY: 1x/week  PT DURATION: 8  weeks  PLANNED INTERVENTIONS: 97164- PT Re-evaluation, 97750- Physical Performance Testing, 97110-Therapeutic exercises, 97530- Therapeutic activity, [id number]- Neuromuscular re-education, 97535- Self Care, [id number]- Manual therapy, Patient/Family education, Balance training, and Stair training.  PLAN FOR NEXT SESSION: Review HEP and progress PRN, progress weight bearing exercises, postural and core strengthening, balance training   Elaine Daring, PT, DPT, LAT, ATC 04/14/24  12:50 PM Phone: [phone] Fax: [phone]

## 2024-04-21 ENCOUNTER — Ambulatory Visit: Admitting: Physical Therapy

## 2024-04-21 ENCOUNTER — Encounter: Payer: Self-pay | Admitting: Physical Therapy

## 2024-04-21 ENCOUNTER — Other Ambulatory Visit: Payer: Self-pay

## 2024-04-21 DIAGNOSIS — M6281 Muscle weakness (generalized): Secondary | ICD-10-CM

## 2024-04-21 NOTE — Therapy (Addendum)
 " OUTPATIENT PHYSICAL THERAPY TREATMENT  DISCHARGE   Patient Name: Cynthia Herman MRN: [id number] DOB:February 27, 1951, 73 y.o., female Today's Date: 04/21/2024   END OF SESSION:  PT End of Session - 04/21/24 1156     Visit Number 3    Number of Visits 9    Date for Recertification  06/02/24    Authorization Type [employer] MCR    Progress Note Due on Visit 10    PT Start Time 1153    PT Stop Time 1231    PT Time Calculation (min) 38 min    Activity Tolerance Patient tolerated treatment well    Behavior During Therapy [facility] for tasks assessed/performed            History reviewed. No pertinent past medical history. History reviewed. No pertinent surgical history. Patient Active Problem List   Diagnosis Date Noted   Osteoporosis 04/05/2023   Chronic bilateral low back pain without sciatica 12/15/2015   Chronic fatigue 12/15/2015    PCP: None  REFERRING PROVIDER: Lequita Evalene LABOR, MD  REFERRING DIAG: Age-related osteoporosis without current pathological fracture  Rationale for Evaluation and Treatment: Rehabilitation  THERAPY DIAG:  Muscle weakness (generalized)  ONSET DATE: Chronic (referral date 03/31/2024)   SUBJECTIVE SUBJECTIVE STATEMENT: Patient reports she is just feeling tired today.   Eval: Patient reports osteoporosis in femoral neck and she has been doing exercises at home and with a personal trainer, but the personal trainer is leaving for a while. She reports she is not able to take medication for the osteoporosis and her most recent scan showed that her bone mineral density has not gotten worse, but also did not improve. She would like to work on exercises for home and the gym to improve her bone mineral density. Below is a list of exercises patient is currently doing at home:  LAQ, Standing hamstring curls, Squats, Standing hip abduction, Standing march, Wall squat with weights overhead, Heel raises edge of step, Upper body weights with dumbbells, bicep curls,  overhead press, high pull, Brisk walk 20 minutes  PERTINENT HISTORY:  See PMH above  PAIN:  Are you having pain? No  PRECAUTIONS: None  PATIENT GOALS: Exercise for osteoporosis    OBJECTIVE:  Note: Objective measures were completed at Evaluation unless otherwise noted. PATIENT SURVEYS:  Not assessed at eval  MUSCLE LENGTH: Not assessed  POSTURE:   Mild rounded shoulder posture  PALPATION: Not assessed  LOWER EXTREMITY MMT:    MMT Right eval Left eval  Hip flexion 4 4  Hip extension 4 4  Hip abduction 4- 4-  Hip adduction    Hip internal rotation    Hip external rotation    Knee flexion 5 5  Knee extension 5 5  Ankle dorsiflexion    Ankle plantarflexion    Ankle inversion    Ankle eversion     (Blank rows = not tested)  FUNCTIONAL TESTS:  Sit to stand: patient able to stand from standard height chair without UE support, she does exhibit bilateral knee valgus  SLS: right 12 sec, left 7 sec  GAIT: Assistive device utilized: None Level of assistance: Complete Independence Comments: [facility]   TREATMENT  [facility] Adult PT Treatment:                                                DATE: 04/21/2024 Marching  bridge 2 x 5 each 90-90 alternating leg extension 3 x 10 Squat to table tap holding 15# 3 x 10 Bent over row supported on table with 15# 3 x 8 Deadlift with 20# from 4 box 3 x 8 SLS 3 x 30 sec   PATIENT EDUCATION:  Education details: HEP Person educated: Patient Education method: Programmer, Multimedia, Demonstration, Tactile cues, Verbal cues Education comprehension: verbalized understanding, returned demonstration, verbal cues required, tactile cues required, and needs further education  HOME EXERCISE PROGRAM: Access Code: [email or web address]    ASSESSMENT: CLINICAL IMPRESSION: Patient tolerated therapy well with no adverse effects. Therapy focused on progressing her strength and balance with good tolerance. Incorporated more core stabilization and progressing postural  strengthening. She does exhibit continued difficulty with her single leg balance and control. She did not report any pain with therapy this visit. No changes to her HEP this visit. Patient would benefit from continued skilled PT to progress mobility and strength in order to reduce pain and maximize functional ability.   Eval: Patient is a 73 y.o. female who was seen today for physical therapy evaluation and treatment for osteoporosis and exercises to improve her bone mineral density. She does exhibit some postural deviations, strength deficits, and balance impairment that could place her at risk for future injury with osteoporosis, so patient would benefit from skilled PT to progress her strength and control in order to reduce risk for injury and maximize her functional ability.   OBJECTIVE IMPAIRMENTS: decreased activity tolerance, decreased strength, and postural dysfunction.   ACTIVITY LIMITATIONS: lifting  PARTICIPATION LIMITATIONS: community activity  PERSONAL FACTORS: Fitness, Past/current experiences, and Time since onset of injury/illness/exacerbation are also affecting patient's functional outcome.    GOALS: Goals reviewed with patient? Yes  SHORT TERM GOALS: Target date: = LTG  LONG TERM GOALS: Target date: 06/02/2024  Patient will be I with final HEP to maintain progress from PT. Baseline: HEP provided at eval Goal status: INITIAL  2.  Patient will demonstrate hip abductor strength >/= 4/5 MMT in order to improve hip strength and control with squatting and stairs Baseline: grossly 4-/5 MMT Goal status: INITIAL  3.  Patient will demonstrate SLS >/= 30 sec each in order to improve her balance and reduce fall risk Baseline: see limitations above Goal status: INITIAL   PLAN: PT FREQUENCY: 1x/week  PT DURATION: 8 weeks  PLANNED INTERVENTIONS: 97164- PT Re-evaluation, 97750- Physical Performance Testing, 97110-Therapeutic exercises, 97530- Therapeutic activity, [id number]-  Neuromuscular re-education, 97535- Self Care, [id number]- Manual therapy, Patient/Family education, Balance training, and Stair training.  PLAN FOR NEXT SESSION: Review HEP and progress PRN, progress weight bearing exercises, postural and core strengthening, balance training   Elaine Daring, PT, DPT, LAT, ATC 04/21/24  12:37 PM Phone: [phone] Fax: [phone]  PHYSICAL THERAPY DISCHARGE SUMMARY  Visits from Start of Care: 3  Current functional level related to goals / functional outcomes: See above   Remaining deficits: See above   Education / Equipment: HEP   Patient agrees to discharge. Patient goals were not met. Patient is being discharged due to not returning since the last visit.  Elaine Daring, PT, DPT, LAT, ATC 07/02/2024  1:39 PM Phone: [phone] Fax: [phone]   "

## 2024-04-23 ENCOUNTER — Emergency Department (HOSPITAL_COMMUNITY)

## 2024-04-23 ENCOUNTER — Encounter (HOSPITAL_COMMUNITY): Payer: Self-pay | Admitting: Emergency Medicine

## 2024-04-23 ENCOUNTER — Emergency Department (HOSPITAL_COMMUNITY)
Admission: EM | Admit: 2024-04-23 | Discharge: 2024-04-23 | Disposition: A | Discharge status: Home / Self Care | Attending: Emergency Medicine | Admitting: Emergency Medicine

## 2024-04-23 ENCOUNTER — Other Ambulatory Visit: Payer: Self-pay

## 2024-04-23 DIAGNOSIS — W1830XA Fall on same level, unspecified, initial encounter: Secondary | ICD-10-CM | POA: Diagnosis not present

## 2024-04-23 DIAGNOSIS — M542 Cervicalgia: Secondary | ICD-10-CM | POA: Insufficient documentation

## 2024-04-23 DIAGNOSIS — S0181XA Laceration without foreign body of other part of head, initial encounter: Secondary | ICD-10-CM | POA: Diagnosis not present

## 2024-04-23 DIAGNOSIS — M25511 Pain in right shoulder: Secondary | ICD-10-CM | POA: Diagnosis not present

## 2024-04-23 DIAGNOSIS — Z23 Encounter for immunization: Secondary | ICD-10-CM | POA: Diagnosis not present

## 2024-04-23 DIAGNOSIS — S0990XA Unspecified injury of head, initial encounter: Secondary | ICD-10-CM | POA: Insufficient documentation

## 2024-04-23 DIAGNOSIS — Y9389 Activity, other specified: Secondary | ICD-10-CM | POA: Diagnosis not present

## 2024-04-23 DIAGNOSIS — W19XXXA Unspecified fall, initial encounter: Secondary | ICD-10-CM

## 2024-04-23 HISTORY — DX: Osteopetrosis: Q78.2

## 2024-04-23 HISTORY — DX: Unspecified macular degeneration: H35.30

## 2024-04-23 MED ORDER — ACETAMINOPHEN 325 MG PO TABS
650.0000 mg | ORAL_TABLET | Freq: Once | ORAL | Status: AC
  Administered 2024-04-23: 650 mg via ORAL
  Filled 2024-04-23: qty 2

## 2024-04-23 MED ORDER — TETANUS-DIPHTH-ACELL PERTUSSIS 5-2-15.5 LF-MCG/0.5 IM SUSP
0.5000 mL | Freq: Once | INTRAMUSCULAR | Status: AC
  Administered 2024-04-23: 0.5 mL via INTRAMUSCULAR
  Filled 2024-04-23: qty 0.5

## 2024-04-23 NOTE — ED Triage Notes (Signed)
 Patient arrives by POV states she was bringing the trash can inside with the lid open and it fell on her causing her to fall in the yard. Patient has redness to right side of face and small cut to right cheek. Reports seeing stars when falling but no LOC. No blood thinners.

## 2024-04-23 NOTE — ED Provider Notes (Signed)
 [place] EMERGENCY DEPARTMENT AT [facility] Provider Note   CSN: [id number] Arrival date & time: 04/23/24  1744     Patient presents with: Cynthia Herman is a 73 y.o. female who presents to the emergency department with a chief complaint of fall.  Patient states that she was bringing in her trash can from outside whenever she had a mechanical fall and fell backwards hitting the back of her head and the trash can fell directly onto her face.  Patient denies blood thinning medications, denies nausea, vomiting, visual disturbances, severe headache.  Patient does appreciate some mild neck pain.  Patient reports seeing stars whenever she fell but denies loss of consciousness.  Past medical history significant for osteoporosis, chronic bilateral lower back pain without sciatica, chronic fatigue, macular degeneration, etc.    Fall       Prior to Admission medications   Medication Sig Start Date End Date Taking? Authorizing Provider  beta carotene 30 MG capsule Take 30 mg by mouth daily.    [provider]  co-enzyme Q-10 30 MG capsule Take 30 mg by mouth 3 (three) times daily.    [provider]  omega-3 acid ethyl esters (LOVAZA) 1 G capsule Take by mouth 2 (two) times daily.    [provider]  pyridOXINE (VITAMIN B-6) 100 MG tablet Take 100 mg by mouth daily.    [provider]  vitamin C (ASCORBIC ACID) 250 MG tablet Take 250 mg by mouth daily.    [provider]  vitamin E 100 UNIT capsule Take by mouth daily.    [provider]    Allergies: Hydrocodeine [dihydrocodeine] and Tape    Review of Systems  Skin:  Positive for wound (Laceration present under right eye).    Updated Vital Signs BP 105/73 (BP Location: Left Arm)   Pulse 60   Temp 98 F (36.7 C) (Oral)   Resp 18   Ht 5' 6 (1.676 m)   Wt 54.4 kg   SpO2 98%   BMI 19.37 kg/m   Physical Exam Vitals and nursing note reviewed.   Constitutional:      General: She is awake. She is not in acute distress.    Appearance: Normal appearance. She is not ill-appearing, toxic-appearing or diaphoretic.  HENT:     Head: Normocephalic.     Comments: Swelling versus hematoma present to anterior forehead with surrounding ecchymosis, laceration present inferior to right eye approximately 1 inch in length, no bone appreciated, no foreign bodies  Mild bruising surrounding right eye however no true raccoon eyes, no Battle sign  Anterior scalp tender to palpation as well as posterior scalp    Nose:     Comments: Tenderness to palpation of nose worse on R side, significant R-sided swelling Eyes:     General: No scleral icterus.    Extraocular Movements: Extraocular movements intact.     Conjunctiva/sclera: Conjunctivae normal.     Pupils: Pupils are equal, round, and reactive to light.     Comments: Vision grossly intact  Neck:     Comments: Mild cervical spine tenderness with palpation, patient able to look left, right, touch chin to chest, and look up at the ceiling Pulmonary:     Effort: Pulmonary effort is normal. No respiratory distress.  Musculoskeletal:     Right lower leg: No edema.     Left lower leg: No edema.     Comments: R shoulder tender to palpation with  slightly reduced ROM due to pain, patient unable to fully extend R arm over her head , no tenderness with palpation of distal R humerus, R elbow, forearm, wrist, or hand  No pain with palpation of LUE  Normal grip strength of bilateral upper extremities, Pelvis feels stable, no lower extremity tenderness  Patient ambulatory without assistance and without pain  Bilateral upper and lower extremities neurovascularly intact  Skin:    General: Skin is warm.     Capillary Refill: Capillary refill takes less than 2 seconds.     Comments: 1 inch laceration present under R eye  Neurological:     General: No focal deficit present.     Mental Status: She is alert  and oriented to person, place, and time.     Gait: Gait normal.  Psychiatric:        Mood and Affect: Mood normal.        Behavior: Behavior normal. Behavior is cooperative.     (all labs ordered are listed, but only abnormal results are displayed) Labs Reviewed - No data to display  EKG: None  Radiology: CT Head Wo Contrast Result Date: 04/23/2024 EXAM: CT Head, Facial Bones and Cervical Spine Without Contrast 04/23/2024 07:42:06 PM TECHNIQUE: CT of the head, facial bones and cervical spine was performed without the administration of intravenous contrast. Multiplanar reformatted images are provided for review. Automated exposure control, iterative reconstruction, and/or weight based adjustment of the mA/kV was utilized to reduce the radiation dose to as low as reasonably achievable. COMPARISON: None available. CLINICAL HISTORY: Fall, head injury FINDINGS: CT HEAD BRAIN AND VENTRICLES: No acute intracranial hemorrhage. No mass effect or midline shift. No extra-axial fluid collection. No evidence of acute infarct. No hydrocephalus. SKULL AND SCALP: No acute skull fracture. Right forehead contusion. CT FACIAL BONES FACIAL BONES: Mildly depressed/displaced right nasal bone fracture. No mandibular dislocation. ORBITS: No acute traumatic injury. SINUSES AND MASTOIDS: Paranasal sinus mucosal thickening, greatest in the maxillary sinuses. SOFT TISSUES: Nasal and right cheek contusions. CT CERVICAL SPINE BONES AND ALIGNMENT: No acute fracture or traumatic malalignment. DEGENERATIVE CHANGES: Mild for age degenerative changes. SOFT TISSUES: No prevertebral soft tissue swelling. IMPRESSION: 1. No acute intracranial abnormality. 2. No acute fracture or traumatic malalignment of the cervical spine. 3. Mildly depressed/displaced right nasal bone fracture. 4. Paranasal sinus mucosal thickening, greatest in the maxillary sinuses. Electronically signed by: Gilmore Molt MD 04/23/2024 07:56 PM EST RP Workstation:  [id number]   CT Cervical Spine Wo Contrast Result Date: 04/23/2024 EXAM: CT Head, Facial Bones and Cervical Spine Without Contrast 04/23/2024 07:42:06 PM TECHNIQUE: CT of the head, facial bones and cervical spine was performed without the administration of intravenous contrast. Multiplanar reformatted images are provided for review. Automated exposure control, iterative reconstruction, and/or weight based adjustment of the mA/kV was utilized to reduce the radiation dose to as low as reasonably achievable. COMPARISON: None available. CLINICAL HISTORY: Fall, head injury FINDINGS: CT HEAD BRAIN AND VENTRICLES: No acute intracranial hemorrhage. No mass effect or midline shift. No extra-axial fluid collection. No evidence of acute infarct. No hydrocephalus. SKULL AND SCALP: No acute skull fracture. Right forehead contusion. CT FACIAL BONES FACIAL BONES: Mildly depressed/displaced right nasal bone fracture. No mandibular dislocation. ORBITS: No acute traumatic injury. SINUSES AND MASTOIDS: Paranasal sinus mucosal thickening, greatest in the maxillary sinuses. SOFT TISSUES: Nasal and right cheek contusions. CT CERVICAL SPINE BONES AND ALIGNMENT: No acute fracture or traumatic malalignment. DEGENERATIVE CHANGES: Mild for age degenerative changes. SOFT TISSUES: No prevertebral soft tissue  swelling. IMPRESSION: 1. No acute intracranial abnormality. 2. No acute fracture or traumatic malalignment of the cervical spine. 3. Mildly depressed/displaced right nasal bone fracture. 4. Paranasal sinus mucosal thickening, greatest in the maxillary sinuses. Electronically signed by: Gilmore Molt MD 04/23/2024 07:56 PM EST RP Workstation: [id number]   CT Maxillofacial Wo Contrast Result Date: 04/23/2024 EXAM: CT Head, Facial Bones and Cervical Spine Without Contrast 04/23/2024 07:42:06 PM TECHNIQUE: CT of the head, facial bones and cervical spine was performed without the administration of intravenous contrast. Multiplanar  reformatted images are provided for review. Automated exposure control, iterative reconstruction, and/or weight based adjustment of the mA/kV was utilized to reduce the radiation dose to as low as reasonably achievable. COMPARISON: None available. CLINICAL HISTORY: Fall, head injury FINDINGS: CT HEAD BRAIN AND VENTRICLES: No acute intracranial hemorrhage. No mass effect or midline shift. No extra-axial fluid collection. No evidence of acute infarct. No hydrocephalus. SKULL AND SCALP: No acute skull fracture. Right forehead contusion. CT FACIAL BONES FACIAL BONES: Mildly depressed/displaced right nasal bone fracture. No mandibular dislocation. ORBITS: No acute traumatic injury. SINUSES AND MASTOIDS: Paranasal sinus mucosal thickening, greatest in the maxillary sinuses. SOFT TISSUES: Nasal and right cheek contusions. CT CERVICAL SPINE BONES AND ALIGNMENT: No acute fracture or traumatic malalignment. DEGENERATIVE CHANGES: Mild for age degenerative changes. SOFT TISSUES: No prevertebral soft tissue swelling. IMPRESSION: 1. No acute intracranial abnormality. 2. No acute fracture or traumatic malalignment of the cervical spine. 3. Mildly depressed/displaced right nasal bone fracture. 4. Paranasal sinus mucosal thickening, greatest in the maxillary sinuses. Electronically signed by: Gilmore Molt MD 04/23/2024 07:56 PM EST RP Workstation: [id number]   DG Shoulder Right Result Date: 04/23/2024 CLINICAL DATA:  Right shoulder pain, fell EXAM: RIGHT SHOULDER - 2+ VIEW COMPARISON:  None Available. FINDINGS: Internal rotation, external rotation, and transscapular views of the right shoulder are obtained. No acute fracture, subluxation, or dislocation. Mild glenohumeral and acromioclavicular joint osteoarthritis. The soft tissues are unremarkable. Visualized portions of the right chest are clear. IMPRESSION: 1. Osteoarthritis.  No acute fracture. Electronically Signed   By: Ozell Daring M.D.   On: 04/23/2024 19:17      .Laceration Repair  Date/Time: 04/24/2024 1:26 AM  Performed by: Janetta Terrall FALCON, PA-C Authorized by: Janetta Terrall FALCON, PA-C   Consent:    Consent obtained:  Verbal   Consent given by:  Patient   Risks, benefits, and alternatives were discussed: yes     Risks discussed:  Infection, pain, retained foreign body, need for additional repair, poor cosmetic result, nerve damage, poor wound healing and vascular damage   Alternatives discussed:  No treatment and observation Universal protocol:    Procedure explained and questions answered to patient or proxy's satisfaction: yes     Patient identity confirmed:  Verbally with patient and arm band Anesthesia:    Anesthesia method:  None Laceration details:    Location:  Face   Face location:  R cheek   Length (cm):  2.5 Exploration:    Limited defect created (wound extended): yes   Treatment:    Area cleansed with:  Saline   Amount of cleaning:  Standard Skin repair:    Repair method:  Tissue adhesive and Steri-Strips   Number of Steri-Strips:  2 Approximation:    Approximation:  Close Repair type:    Repair type:  Simple Post-procedure details:    Dressing:  Open (no dressing)   Procedure completion:  Tolerated well, no immediate complications    Medications Ordered in the ED  acetaminophen (TYLENOL) tablet 650 mg (650 mg Oral Given 04/23/24 1915)  Tdap (ADACEL) injection 0.5 mL (0.5 mLs Intramuscular Given 04/23/24 2326)                                    Medical Decision Making Amount and/or Complexity of Data Reviewed Radiology: ordered.  Risk OTC drugs. Prescription drug management.   Patient presents to the ED for concern of fall, head injury, laceration, this involves an extensive number of treatment options, and is a complaint that carries with it a high risk of complications and morbidity.  The differential diagnosis includes brain bleed, skull fracture, neck fracture, laceration, soft tissue injury,  etc.   Co morbidities that complicate the patient evaluation  osteoporosis, chronic bilateral lower back pain without sciatica, chronic fatigue, macular degeneration   Imaging Studies ordered:  I ordered imaging studies including CT of head, cervical spine, maxillofacial, x-ray of right shoulder I independently visualized and interpreted imaging which showed:  CT head/cervical spine/maxillofacial: Mildly depressed/displaced right nasal bone fracture, paranasal sinus mucosal thickening greatest in the maxillary sinus X-ray of right shoulder: Osteoarthritis with no acute fracture I agree with the radiologist interpretation  Medicines ordered and prescription drug management:  I ordered medication including tetanus shot for tetanus prophylaxis, Tylenol for pain Reevaluation of the patient after these medicines showed that the patient improved I have reviewed the patients home medicines and have made adjustments as needed   Test Considered:  None   Critical Interventions:  None   Problem List / ED Course:  74 year old female, mechanical fall, vital signs stable, no blood thinner On physical examination significant bruising present to anterior forehead and right side of face, also neck tenderness as well as right shoulder tenderness Imaging significant for mildly depressed/displaced right nasal bone fracture, otherwise reassuring Patient educated on workup and findings, offered laceration repair for right cheek, patient concerned about laceration repair here in the emergency department, would prefer possible intervention by specialist like a dermatologist or plastic surgeon, educated patient that there are no on-call dermatologist or plastic surgeons would come into the emergency department to repair this laceration, laceration repair was offered with sutures however patient states that she would prefer Steri-Strips and skin glue Laceration repair performed by myself, 2 Steri-Strips  placed in a vertical fashion onto horizontal laceration, skin was well-approximated Most likely diagnosis at this time is acute injuries related to mechanical fall, no acute intracranial process, right nasal bone fracture, laceration repaired Return precautions given Patient discharged and given instructions on laceration care and follow-up with ENT for nasal fracture   Reevaluation:  After the interventions noted above, I reevaluated the patient and found that they have :improved   Social Determinants of Health:  None   Dispostion:  After consideration of the diagnostic results and the patients response to treatment, I feel that the patent would benefit from discharge and outpatient therapy as described, follow-up with ENT for nasal fracture.      Final diagnoses:  Injury of head, initial encounter  Facial laceration, initial encounter  Fall, initial encounter    ED Discharge Orders     None          Janetta Terrall FALCON, PA-C 04/24/24 0130    Emil Share, DO 04/26/24 1502

## 2024-04-23 NOTE — ED Provider Triage Note (Signed)
 Emergency Medicine Provider Triage Evaluation Note  EDILIA Herman , a 73 y.o. female  was evaluated in triage.  Pt complains of fall. Patient states that patient was bringing trash an inside with the lid open and she fell backwards with the trash can falling on top of her. Laceration under R eye. No blood thinner. Patient complaining of head pain, face pain, neck pain, as well as R shoulder pain.  Review of Systems  Positive: Head pain, neck pain, face pain, R shoulder pain Negative: Blood thinner, other extremity pain, visual changes  Physical Exam  BP (!) 153/88   Pulse 72   Temp 98.1 F (36.7 C) (Oral)   Resp 18   Ht 5' 6 (1.676 m)   Wt 54.4 kg   SpO2 100%   BMI 19.37 kg/m  Gen:   Awake, no distress   Resp:  Normal effort  MSK:   Moves extremities without difficulty  Other:  Laceration present under R eye, redness to face, forehead, neck tender to palpation as well as R shoulder  Medical Decision Making  Medically screening exam initiated at 6:49 PM.  Appropriate orders placed.  Cynthia Herman was informed that the remainder of the evaluation will be completed by another provider, this initial triage assessment does not replace that evaluation, and the importance of remaining in the ED until their evaluation is complete.  Orders: CT head, face, neck, xray R shoulder, tylenol   Janetta Terrall FALCON, PA-C 04/23/24 1851

## 2024-04-23 NOTE — Discharge Instructions (Addendum)
 It was a pleasure taking care of you today.  Based on your history, physical exam, and imaging I feel you are safe for discharge.  Today the CT scan of your face did show a nasal fracture.  Because of this you have been given information to follow-up with ENT.  Recommend follow-up with ENT in 1 week as swelling goes down for evaluation of your nasal fracture.  Today Steri-Strips were placed to your right cheek area where the laceration is present. Please allow the steri strips to fall off on their own over the next few weeks and keep the wound area dry as to not disturb the glue and steri strips.  If you experience any the following symptoms including but not limited to severe headache, neck pain, nausea, vomiting, issues with balance or coordination, issues walking, signs of infection surrounding laceration site including redness, fever, chills, or abnormal drainage please return to the ED or seek further medical care. Today your tetanus was also updated. Please make your PCP aware of workup and findings today.

## 2024-04-28 ENCOUNTER — Encounter: Admitting: Physical Therapy

## 2024-05-05 ENCOUNTER — Telehealth: Payer: Self-pay | Admitting: Physical Therapy

## 2024-05-05 ENCOUNTER — Encounter: Admitting: Physical Therapy

## 2024-05-05 NOTE — Telephone Encounter (Signed)
 Patient would like to wait to schedule more appointments until after the first of the year as she is in the processing of moving right now. FYI.
